# Patient Record
Sex: Male | Born: 1955 | Race: White | Hispanic: No | Marital: Married | Smoking: Former smoker
Health system: Southern US, Community
[De-identification: ages and names within clinical notes are randomized; demographics above are authoritative.]

## PROBLEM LIST (undated history)

## (undated) DIAGNOSIS — I2510 Atherosclerotic heart disease of native coronary artery without angina pectoris: Secondary | ICD-10-CM

## (undated) DIAGNOSIS — M1990 Unspecified osteoarthritis, unspecified site: Secondary | ICD-10-CM

## (undated) DIAGNOSIS — I209 Angina pectoris, unspecified: Secondary | ICD-10-CM

## (undated) DIAGNOSIS — I10 Essential (primary) hypertension: Secondary | ICD-10-CM

## (undated) DIAGNOSIS — I219 Acute myocardial infarction, unspecified: Secondary | ICD-10-CM

## (undated) HISTORY — PX: NASAL FRACTURE SURGERY: SHX718

## (undated) HISTORY — PX: TONSILLECTOMY: SUR1361

---

## 2003-08-13 HISTORY — PX: CARDIAC CATHETERIZATION: SHX172

## 2013-05-18 ENCOUNTER — Other Ambulatory Visit: Payer: Self-pay | Admitting: Neurological Surgery

## 2013-05-18 DIAGNOSIS — M549 Dorsalgia, unspecified: Secondary | ICD-10-CM

## 2013-05-24 ENCOUNTER — Ambulatory Visit
Admission: RE | Admit: 2013-05-24 | Discharge: 2013-05-24 | Disposition: A | Discharge status: Home / Self Care | Payer: Medicare Other | Source: Ambulatory Visit | Attending: Neurological Surgery | Admitting: Neurological Surgery

## 2013-05-24 VITALS — BP 125/84 | HR 61

## 2013-05-24 DIAGNOSIS — M549 Dorsalgia, unspecified: Secondary | ICD-10-CM

## 2013-05-24 MED ORDER — IOHEXOL 180 MG/ML  SOLN
15.0000 mL | Freq: Once | INTRAMUSCULAR | Status: AC | PRN
  Administered 2013-05-24: 20 mL via INTRATHECAL

## 2013-05-24 MED ORDER — DIAZEPAM 5 MG PO TABS: 10.0000 mg | ORAL_TABLET | Freq: Once | ORAL | Status: AC

## 2013-05-24 MED ORDER — MEPERIDINE HCL 100 MG/ML IJ SOLN
100.0000 mg | Freq: Once | INTRAMUSCULAR | Status: AC
  Administered 2013-05-24: 100 mg via INTRAMUSCULAR

## 2013-05-24 MED ORDER — ONDANSETRON HCL 4 MG/2ML IJ SOLN: 4.0000 mg | Freq: Four times a day (QID) | INTRAMUSCULAR | Status: DC | PRN

## 2013-05-24 MED ORDER — ONDANSETRON HCL 4 MG/2ML IJ SOLN
4.0000 mg | Freq: Once | INTRAMUSCULAR | Status: AC
  Administered 2013-05-24: 4 mg via INTRAMUSCULAR

## 2013-05-24 MED ORDER — DIAZEPAM 5 MG PO TABS
10.0000 mg | ORAL_TABLET | Freq: Once | ORAL | Status: AC
  Administered 2013-05-24: 10 mg via ORAL

## 2013-05-24 NOTE — Progress Notes (Signed)
Pt states he has been off tramadol for the past 2-3 days. Discharge instructions explained to pt.

## 2013-07-04 ENCOUNTER — Other Ambulatory Visit: Payer: Self-pay | Admitting: Anesthesiology

## 2013-07-04 DIAGNOSIS — M549 Dorsalgia, unspecified: Secondary | ICD-10-CM

## 2013-07-05 ENCOUNTER — Ambulatory Visit
Admission: RE | Admit: 2013-07-05 | Discharge: 2013-07-05 | Disposition: A | Discharge status: Home / Self Care | Payer: Medicare Other | Source: Ambulatory Visit | Attending: Anesthesiology | Admitting: Anesthesiology

## 2013-07-05 DIAGNOSIS — M549 Dorsalgia, unspecified: Secondary | ICD-10-CM

## 2013-07-16 ENCOUNTER — Other Ambulatory Visit: Payer: Self-pay | Admitting: Neurological Surgery

## 2013-07-31 ENCOUNTER — Inpatient Hospital Stay (HOSPITAL_COMMUNITY)
Admission: RE | Admit: 2013-07-31 | Discharge: 2013-07-31 | Disposition: A | Discharge status: Home / Self Care | Payer: Medicare Other | Source: Ambulatory Visit

## 2013-07-31 NOTE — Pre-Procedure Instructions (Signed)
Dustin NewcomerRobert Kim  07/31/2013   Your procedure is scheduled on:  Wed, Feb 25 @ 12:50 PM  Report to [facility] Entrance A  at 10:45 AM.  Call this number if you have problems the morning of surgery: [phone]   Remember:   Do not eat food or drink liquids after midnight.   Take these medicines the morning of surgery with A SIP OF WATER: Gabapentin(Neurontin) and Metoprolol(Lopressor)               Stop taking your Krill Oil and Aspirin. No Goody's,BC's,Aleve,Ibuprofen,or any Herbal Medications   Do not wear jewelry  Do not wear lotions, powders, or colognes. You may wear deodorant.  Men may shave face and neck.  Do not bring valuables to the hospital.  [facility] is not responsible                  for any belongings or valuables.               Contacts, dentures or bridgework may not be worn into surgery.  Leave suitcase in the car. After surgery it may be brought to your room.  For patients admitted to the hospital, discharge time is determined by your                treatment team.                 Special Instructions:  [place] - Preparing for Surgery  Before surgery, you can play an important role.  Because skin is not sterile, your skin needs to be as free of germs as possible.  You can reduce the number of germs on you skin by washing with CHG (chlorahexidine gluconate) soap before surgery.  CHG is an antiseptic cleaner which kills germs and bonds with the skin to continue killing germs even after washing.  Please DO NOT use if you have an allergy to CHG or antibacterial soaps.  If your skin becomes reddened/irritated stop using the CHG and inform your nurse when you arrive at Short Stay.  Do not shave (including legs and underarms) for at least 48 hours prior to the first CHG shower.  You may shave your face.  Please follow these instructions carefully:   1.  Shower with CHG Soap the night before surgery and the                                morning of  Surgery.  2.  If you choose to wash your hair, wash your hair first as usual with your       normal shampoo.  3.  After you shampoo, rinse your hair and body thoroughly to remove the                      Shampoo.  4.  Use CHG as you would any other liquid soap.  You can apply chg directly       to the skin and wash gently with scrungie or a clean washcloth.  5.  Apply the CHG Soap to your body ONLY FROM THE NECK DOWN.        Do not use on open wounds or open sores.  Avoid contact with your eyes,       ears, mouth and genitals (private parts).  Wash genitals (private parts)       with your normal  soap.  6.  Wash thoroughly, paying special attention to the area where your surgery        will be performed.  7.  Thoroughly rinse your body with warm water from the neck down.  8.  DO NOT shower/wash with your normal soap after using and rinsing off       the CHG Soap.  9.  Pat yourself dry with a clean towel.            10.  Wear clean pajamas.            11.  Place clean sheets on your bed the night of your first shower and do not        sleep with pets.  Day of Surgery  Do not apply any lotions/deoderants the morning of surgery.  Please wear clean clothes to the hospital/surgery center.     Please read over the following fact sheets that you were given: Pain Booklet, Coughing and Deep Breathing, Blood Transfusion Information, MRSA Information and Surgical Site Infection Prevention

## 2013-08-01 ENCOUNTER — Encounter (HOSPITAL_COMMUNITY)
Admission: RE | Admit: 2013-08-01 | Discharge: 2013-08-01 | Disposition: A | Discharge status: Home / Self Care | Payer: Medicare Other | Source: Ambulatory Visit | Attending: Neurological Surgery | Admitting: Neurological Surgery

## 2013-08-01 ENCOUNTER — Encounter (HOSPITAL_COMMUNITY): Payer: Self-pay

## 2013-08-01 DIAGNOSIS — Z01812 Encounter for preprocedural laboratory examination: Secondary | ICD-10-CM | POA: Insufficient documentation

## 2013-08-01 DIAGNOSIS — Z01818 Encounter for other preprocedural examination: Secondary | ICD-10-CM | POA: Insufficient documentation

## 2013-08-01 DIAGNOSIS — Z01810 Encounter for preprocedural cardiovascular examination: Secondary | ICD-10-CM | POA: Insufficient documentation

## 2013-08-01 HISTORY — DX: Unspecified osteoarthritis, unspecified site: M19.90

## 2013-08-01 HISTORY — DX: Acute myocardial infarction, unspecified: I21.9

## 2013-08-01 HISTORY — DX: Essential (primary) hypertension: I10

## 2013-08-01 HISTORY — DX: Atherosclerotic heart disease of native coronary artery without angina pectoris: I25.10

## 2013-08-01 HISTORY — DX: Angina pectoris, unspecified: I20.9

## 2013-08-01 LAB — CBC WITH DIFFERENTIAL/PLATELET
Basophils Absolute: 0 10*3/uL (ref 0.0–0.1)
Basophils Relative: 0 % (ref 0–1)
EOS PCT: 1 % (ref 0–5)
Eosinophils Absolute: 0.1 10*3/uL (ref 0.0–0.7)
HCT: 44.3 % (ref 39.0–52.0)
HEMOGLOBIN: 15.5 g/dL (ref 13.0–17.0)
LYMPHS ABS: 3 10*3/uL (ref 0.7–4.0)
LYMPHS PCT: 32 % (ref 12–46)
MCH: 30.3 pg (ref 26.0–34.0)
MCHC: 35 g/dL (ref 30.0–36.0)
MCV: 86.5 fL (ref 78.0–100.0)
Monocytes Absolute: 0.6 10*3/uL (ref 0.1–1.0)
Monocytes Relative: 7 % (ref 3–12)
Neutro Abs: 5.6 10*3/uL (ref 1.7–7.7)
Neutrophils Relative %: 60 % (ref 43–77)
Platelets: 231 10*3/uL (ref 150–400)
RBC: 5.12 MIL/uL (ref 4.22–5.81)
RDW: 13 % (ref 11.5–15.5)
WBC: 9.4 10*3/uL (ref 4.0–10.5)

## 2013-08-01 LAB — BASIC METABOLIC PANEL
BUN: 20 mg/dL (ref 6–23)
CHLORIDE: 103 meq/L (ref 96–112)
CO2: 27 meq/L (ref 19–32)
Calcium: 10.5 mg/dL (ref 8.4–10.5)
Creatinine, Ser: 0.97 mg/dL (ref 0.50–1.35)
GFR calc Af Amer: >90 mL/min (ref >90)
GFR calc non Af Amer: 90 mL/min — ABNORMAL LOW (ref >90)
Glucose, Bld: 95 mg/dL (ref 70–99)
Potassium: 4.4 mEq/L (ref 3.7–5.3)
SODIUM: 142 meq/L (ref 137–147)

## 2013-08-01 LAB — TYPE AND SCREEN
ABO/RH(D): O POS
ANTIBODY SCREEN: NEGATIVE

## 2013-08-01 LAB — SURGICAL PCR SCREEN
MRSA, PCR: NEGATIVE
Staphylococcus aureus: NEGATIVE

## 2013-08-01 LAB — PROTIME-INR
INR: 0.97 (ref 0.00–1.49)
Prothrombin Time: 12.7 seconds (ref 11.6–15.2)

## 2013-08-01 LAB — ABO/RH: ABO/RH(D): O POS

## 2013-08-01 NOTE — Progress Notes (Signed)
req'd notes, stress, echo, ekg from dr Kenyon Anakurt daniel [facility] [phone]

## 2013-08-01 NOTE — Pre-Procedure Instructions (Addendum)
Tereso NewcomerRobert Knappenberger  08/01/2013   Your procedure is scheduled on:  Wed, Feb 25 @ 12:50 PM  Report to [facility] Short Stay Entrance A  at 10:45 AM.  Call this number if you have problems the morning of surgery: [phone]   Remember:   Do not eat food or drink liquids after midnight.   Take these medicines the morning of surgery with A SIP OF WATER: Gabapentin(Neurontin) and Metoprolol(Lopressor)               Stop taking your Krill Oil and Aspirin. No Goody's,BC's,Aleve,Ibuprofen,or any Herbal Medications   Do not wear jewelry  Do not wear lotions, powders, or colognes. You may wear deodorant.  Men may shave face and neck.  Do not bring valuables to the hospital.  [facility] is not responsible                  for any belongings or valuables.               Contacts, dentures or bridgework may not be worn into surgery.  Leave suitcase in the car. After surgery it may be brought to your room.  For patients admitted to the hospital, discharge time is determined by your                treatment team.                 Special Instructions:  [place] - Preparing for Surgery  Before surgery, you can play an important role.  Because skin is not sterile, your skin needs to be as free of germs as possible.  You can reduce the number of germs on you skin by washing with CHG (chlorahexidine gluconate) soap before surgery.  CHG is an antiseptic cleaner which kills germs and bonds with the skin to continue killing germs even after washing.  Please DO NOT use if you have an allergy to CHG or antibacterial soaps.  If your skin becomes reddened/irritated stop using the CHG and inform your nurse when you arrive at Short Stay.  Do not shave (including legs and underarms) for at least 48 hours prior to the first CHG shower.  You may shave your face.  Please follow these instructions carefully:   1.  Shower with CHG Soap the night before surgery and the                                morning of  Surgery.  2.  If you choose to wash your hair, wash your hair first as usual with your       normal shampoo.  3.  After you shampoo, rinse your hair and body thoroughly to remove the                      Shampoo.  4.  Use CHG as you would any other liquid soap.  You can apply chg directly       to the skin and wash gently with scrungie or a clean washcloth.  5.  Apply the CHG Soap to your body ONLY FROM THE NECK DOWN.        Do not use on open wounds or open sores.  Avoid contact with your eyes,       ears, mouth and genitals (private parts).  Wash genitals (private parts)       with your normal  soap.  6.  Wash thoroughly, paying special attention to the area where your surgery        will be performed.  7.  Thoroughly rinse your body with warm water from the neck down.  8.  DO NOT shower/wash with your normal soap after using and rinsing off       the CHG Soap.  9.  Pat yourself dry with a clean towel.            10.  Wear clean pajamas.            11.  Place clean sheets on your bed the night of your first shower and do not        sleep with pets.  Day of Surgery  Do not apply any lotions/deoderants the morning of surgery.  Please wear clean clothes to the hospital/surgery center.     Please read over the following fact sheets that you were given: Pain Booklet, Coughing and Deep Breathing, Blood Transfusion Information, MRSA Information and Surgical Site Infection Prevention

## 2013-08-01 NOTE — Progress Notes (Signed)
08/01/13 1009  OBSTRUCTIVE SLEEP APNEA  Have you ever been diagnosed with sleep apnea through a sleep study? No  Do you snore loudly (loud enough to be heard through closed doors)?  0  Do you often feel tired, fatigued, or sleepy during the daytime? 0  Has anyone observed you stop breathing during your sleep? 0  Do you have, or are you being treated for high blood pressure? 1  BMI more than 35 kg/m2? 1  Age over 58 years old? 1  Neck circumference greater than 40 cm/18 inches? 0 (16.5)  Gender: 1  Obstructive Sleep Apnea Score 4  Score 4 or greater  Results sent to PCP

## 2013-08-02 NOTE — Progress Notes (Addendum)
Anesthesia Chart Review:  Patient is a 58 year old male scheduled for L5-S1 MAS, PLIF on 08/08/13 by Dr. Yetta BarreJones.   History includes CAD/MI s/p LAD stent ~ '03 ([place], [place]), HTN, arthritis, former smoker, tonsillectomy, nasal fracture surgery. BMI of 39 is consistent with obesity. OSA screen was 4. He reported history of angina, but none recently. Cardiologist is Dr. Tollie PizzaKurt Daniel, last visit 07/04/12.  PCP is Dr. Tarri Fullerichard Escajeda.  EKG on 08/01/13 showed SB @ 53 bpm, sinus arrhythmia, minimal voltage criteria for LVH.   Echo on 08/05/10 ([facility]) showed normal left ventricular systolic function with mild septal left ventricular hypertrophy, LVEF 55-60%. Normal diastolic filling pattern for age. Mildly dilated left atrium. Trace MR/PR. Mild TR with normal pulmonary pressures.  Nuclear stress test on 08/05/09 ([facility]) showed: No convincing evidence of ischemia with small fixed defect in the inferior segment consistent with diaphragmatic attenuation. Normal LV systolic function and wall motion with calculated EF 55%. Images showed little or no change since 01/12/2006.  He reported his last cardiac cath was in 2005.  Preoperative CXR and labs noted.   It will be four years since his last stress test and > one year since his last cardiology appointment.  I reviewed above with anesthesiologist Dr. Randa EvensEdwards. Recommend preoperative cardiac evaluation/clearance.  Erie NoeVanessa at Dr. Yetta BarreJones' office notified.    Velna Ochsllison Seaborn Nakama, PA-C [facility]/Anesthesiology Phone [phone] 08/02/2013 4:10 PM  Addendum 08/13/2013 9:38 AM:  Surgery was rescheduled for 08/16/13 to allow for time for preoperative cardiac evaluation.  [facility] by Pricilla Handlerichard Costello, PA-C on 08/08/13.  EKG then showed SB @ 50 bpm, IVCD with non-specific St/T wave changes. No additional cardiac testing was ordered.  Continuation of perioperative b-blocker therapy was recommended.

## 2013-08-15 MED ORDER — DEXAMETHASONE SODIUM PHOSPHATE 10 MG/ML IJ SOLN
10.0000 mg | INTRAMUSCULAR | Status: AC
  Administered 2013-08-16: 10 mg via INTRAVENOUS
  Filled 2013-08-15: qty 1

## 2013-08-15 MED ORDER — CEFAZOLIN SODIUM-DEXTROSE 2-3 GM-% IV SOLR
2.0000 g | INTRAVENOUS | Status: AC
  Administered 2013-08-16: 2 g via INTRAVENOUS
  Filled 2013-08-15: qty 50

## 2013-08-15 NOTE — Progress Notes (Signed)
I spoke with Mr Dustin Kim and he said he was instructed to arrive at 0830 by MD office.

## 2013-08-16 ENCOUNTER — Encounter (HOSPITAL_COMMUNITY)
Admission: RE | Disposition: A | Discharge status: Home / Self Care | Payer: Self-pay | Source: Ambulatory Visit | Attending: Neurological Surgery

## 2013-08-16 ENCOUNTER — Encounter (HOSPITAL_COMMUNITY): Payer: Self-pay | Admitting: *Deleted

## 2013-08-16 ENCOUNTER — Inpatient Hospital Stay (HOSPITAL_COMMUNITY)
Admission: RE | Admit: 2013-08-16 | Discharge: 2013-08-17 | DRG: 460 | Disposition: A | Discharge status: Home / Self Care | Payer: Medicare Other | Source: Ambulatory Visit | Attending: Neurological Surgery | Admitting: Neurological Surgery

## 2013-08-16 ENCOUNTER — Encounter (HOSPITAL_COMMUNITY): Payer: Medicare Other | Admitting: Vascular Surgery

## 2013-08-16 ENCOUNTER — Inpatient Hospital Stay (HOSPITAL_COMMUNITY): Payer: Medicare Other | Admitting: Certified Registered Nurse Anesthetist

## 2013-08-16 ENCOUNTER — Inpatient Hospital Stay (HOSPITAL_COMMUNITY): Payer: Medicare Other

## 2013-08-16 DIAGNOSIS — I10 Essential (primary) hypertension: Secondary | ICD-10-CM | POA: Diagnosis present

## 2013-08-16 DIAGNOSIS — Z981 Arthrodesis status: Secondary | ICD-10-CM

## 2013-08-16 DIAGNOSIS — I2510 Atherosclerotic heart disease of native coronary artery without angina pectoris: Secondary | ICD-10-CM | POA: Diagnosis present

## 2013-08-16 DIAGNOSIS — I252 Old myocardial infarction: Secondary | ICD-10-CM

## 2013-08-16 DIAGNOSIS — Z79899 Other long term (current) drug therapy: Secondary | ICD-10-CM

## 2013-08-16 DIAGNOSIS — M51379 Other intervertebral disc degeneration, lumbosacral region without mention of lumbar back pain or lower extremity pain: Secondary | ICD-10-CM | POA: Diagnosis present

## 2013-08-16 DIAGNOSIS — Z87891 Personal history of nicotine dependence: Secondary | ICD-10-CM

## 2013-08-16 DIAGNOSIS — M47817 Spondylosis without myelopathy or radiculopathy, lumbosacral region: Principal | ICD-10-CM | POA: Diagnosis present

## 2013-08-16 DIAGNOSIS — M5137 Other intervertebral disc degeneration, lumbosacral region: Secondary | ICD-10-CM | POA: Diagnosis present

## 2013-08-16 DIAGNOSIS — Z9861 Coronary angioplasty status: Secondary | ICD-10-CM

## 2013-08-16 DIAGNOSIS — Z7982 Long term (current) use of aspirin: Secondary | ICD-10-CM

## 2013-08-16 HISTORY — PX: MAXIMUM ACCESS (MAS)POSTERIOR LUMBAR INTERBODY FUSION (PLIF) 1 LEVEL: SHX6368

## 2013-08-16 LAB — COMPREHENSIVE METABOLIC PANEL
ALK PHOS: 56 U/L (ref 39–117)
ALT: 23 U/L (ref 0–53)
AST: 21 U/L (ref 0–37)
Albumin: 3.9 g/dL (ref 3.5–5.2)
BILIRUBIN TOTAL: 0.4 mg/dL (ref 0.3–1.2)
BUN: 17 mg/dL (ref 6–23)
CHLORIDE: 104 meq/L (ref 96–112)
CO2: 23 meq/L (ref 19–32)
Calcium: 9.5 mg/dL (ref 8.4–10.5)
Creatinine, Ser: 0.94 mg/dL (ref 0.50–1.35)
GFR calc non Af Amer: >90 mL/min (ref >90)
GLUCOSE: 111 mg/dL — AB (ref 70–99)
POTASSIUM: 3.8 meq/L (ref 3.7–5.3)
SODIUM: 142 meq/L (ref 137–147)
Total Protein: 7.2 g/dL (ref 6.0–8.3)

## 2013-08-16 LAB — CBC WITH DIFFERENTIAL/PLATELET
Basophils Absolute: 0 10*3/uL (ref 0.0–0.1)
Basophils Relative: 0 % (ref 0–1)
Eosinophils Absolute: 0.2 10*3/uL (ref 0.0–0.7)
Eosinophils Relative: 2 % (ref 0–5)
HCT: 43.3 % (ref 39.0–52.0)
HEMOGLOBIN: 15.1 g/dL (ref 13.0–17.0)
LYMPHS ABS: 2.9 10*3/uL (ref 0.7–4.0)
LYMPHS PCT: 32 % (ref 12–46)
MCH: 30 pg (ref 26.0–34.0)
MCHC: 34.9 g/dL (ref 30.0–36.0)
MCV: 86.1 fL (ref 78.0–100.0)
Monocytes Absolute: 0.8 10*3/uL (ref 0.1–1.0)
Monocytes Relative: 9 % (ref 3–12)
NEUTROS PCT: 57 % (ref 43–77)
Neutro Abs: 5.2 10*3/uL (ref 1.7–7.7)
Platelets: 211 10*3/uL (ref 150–400)
RBC: 5.03 MIL/uL (ref 4.22–5.81)
RDW: 13.1 % (ref 11.5–15.5)
WBC: 9.1 10*3/uL (ref 4.0–10.5)

## 2013-08-16 LAB — TYPE AND SCREEN
ABO/RH(D): O POS
ANTIBODY SCREEN: NEGATIVE

## 2013-08-16 LAB — PROTIME-INR
INR: 0.87 (ref 0.00–1.49)
PROTHROMBIN TIME: 11.7 s (ref 11.6–15.2)

## 2013-08-16 SURGERY — FOR MAXIMUM ACCESS (MAS) POSTERIOR LUMBAR INTERBODY FUSION (PLIF) 1 LEVEL
Anesthesia: General | Site: Back

## 2013-08-16 MED ORDER — ACETAMINOPHEN 10 MG/ML IV SOLN
INTRAVENOUS | Status: AC
  Filled 2013-08-16: qty 100

## 2013-08-16 MED ORDER — METHOCARBAMOL 100 MG/ML IJ SOLN: 500.0000 mg | Freq: Four times a day (QID) | INTRAVENOUS | Status: DC | PRN

## 2013-08-16 MED ORDER — ONDANSETRON HCL 4 MG/2ML IJ SOLN
INTRAMUSCULAR | Status: DC | PRN
  Administered 2013-08-16: 4 mg via INTRAVENOUS

## 2013-08-16 MED ORDER — METHOCARBAMOL 500 MG PO TABS
500.0000 mg | ORAL_TABLET | Freq: Four times a day (QID) | ORAL | Status: DC | PRN
  Administered 2013-08-16 – 2013-08-17 (×2): 500 mg via ORAL
  Filled 2013-08-16 (×2): qty 1

## 2013-08-16 MED ORDER — DIAZEPAM 5 MG/ML IJ SOLN
2.5000 mg | Freq: Once | INTRAMUSCULAR | Status: AC
  Administered 2013-08-16: 2.5 mg via INTRAVENOUS

## 2013-08-16 MED ORDER — TRAMADOL HCL 50 MG PO TABS: 50.0000 mg | ORAL_TABLET | Freq: Four times a day (QID) | ORAL | Status: DC | PRN

## 2013-08-16 MED ORDER — GABAPENTIN 300 MG PO CAPS
300.0000 mg | ORAL_CAPSULE | Freq: Three times a day (TID) | ORAL | Status: DC
  Administered 2013-08-16: 300 mg via ORAL
  Filled 2013-08-16 (×4): qty 1

## 2013-08-16 MED ORDER — SURGIFOAM 100 EX MISC
CUTANEOUS | Status: DC | PRN
  Administered 2013-08-16: 12:00:00 via TOPICAL

## 2013-08-16 MED ORDER — FENTANYL CITRATE 0.05 MG/ML IJ SOLN
INTRAMUSCULAR | Status: DC | PRN
  Administered 2013-08-16: 50 ug via INTRAVENOUS
  Administered 2013-08-16 (×2): 100 ug via INTRAVENOUS
  Administered 2013-08-16: 50 ug via INTRAVENOUS

## 2013-08-16 MED ORDER — MIDAZOLAM HCL 2 MG/2ML IJ SOLN
INTRAMUSCULAR | Status: AC
  Filled 2013-08-16: qty 2

## 2013-08-16 MED ORDER — PHENYLEPHRINE HCL 10 MG/ML IJ SOLN
10.0000 mg | INTRAVENOUS | Status: DC | PRN
  Administered 2013-08-16: 25 ug/min via INTRAVENOUS

## 2013-08-16 MED ORDER — MIDAZOLAM HCL 2 MG/2ML IJ SOLN
0.5000 mg | Freq: Once | INTRAMUSCULAR | Status: AC | PRN
  Administered 2013-08-16: 1 mg via INTRAVENOUS

## 2013-08-16 MED ORDER — LACTATED RINGERS IV SOLN
INTRAVENOUS | Status: DC
  Administered 2013-08-16: 09:00:00 via INTRAVENOUS

## 2013-08-16 MED ORDER — HYDROMORPHONE HCL PF 1 MG/ML IJ SOLN
INTRAMUSCULAR | Status: AC
  Administered 2013-08-16: 0.5 mg via INTRAVENOUS
  Filled 2013-08-16: qty 1

## 2013-08-16 MED ORDER — DIAZEPAM 5 MG/ML IJ SOLN
INTRAMUSCULAR | Status: AC
  Administered 2013-08-16: 2.5 mg via INTRAVENOUS
  Filled 2013-08-16: qty 2

## 2013-08-16 MED ORDER — LOSARTAN POTASSIUM 50 MG PO TABS
50.0000 mg | ORAL_TABLET | Freq: Every day | ORAL | Status: DC
  Filled 2013-08-16 (×2): qty 1

## 2013-08-16 MED ORDER — OXYCODONE HCL 5 MG PO TABS
ORAL_TABLET | ORAL | Status: AC
  Filled 2013-08-16: qty 1

## 2013-08-16 MED ORDER — CEFAZOLIN SODIUM 1-5 GM-% IV SOLN
1.0000 g | Freq: Three times a day (TID) | INTRAVENOUS | Status: AC
  Administered 2013-08-16 – 2013-08-17 (×2): 1 g via INTRAVENOUS
  Filled 2013-08-16 (×2): qty 50

## 2013-08-16 MED ORDER — PROPOFOL INFUSION 10 MG/ML OPTIME
INTRAVENOUS | Status: DC | PRN
  Administered 2013-08-16: 50 ug/kg/min via INTRAVENOUS

## 2013-08-16 MED ORDER — HYDROMORPHONE HCL PF 1 MG/ML IJ SOLN
0.2500 mg | INTRAMUSCULAR | Status: AC | PRN
  Administered 2013-08-16 (×8): 0.5 mg via INTRAVENOUS

## 2013-08-16 MED ORDER — OXYCODONE-ACETAMINOPHEN 5-325 MG PO TABS
1.0000 | ORAL_TABLET | ORAL | Status: DC | PRN
  Administered 2013-08-16 – 2013-08-17 (×3): 2 via ORAL
  Filled 2013-08-16 (×3): qty 2

## 2013-08-16 MED ORDER — LIDOCAINE HCL (CARDIAC) 20 MG/ML IV SOLN
INTRAVENOUS | Status: DC | PRN
  Administered 2013-08-16: 40 mg via INTRAVENOUS

## 2013-08-16 MED ORDER — MEPERIDINE HCL 25 MG/ML IJ SOLN: 6.2500 mg | INTRAMUSCULAR | Status: DC | PRN

## 2013-08-16 MED ORDER — PHENOL 1.4 % MT LIQD: 1.0000 | OROMUCOSAL | Status: DC | PRN

## 2013-08-16 MED ORDER — ASPIRIN EC 81 MG PO TBEC
81.0000 mg | DELAYED_RELEASE_TABLET | Freq: Every day | ORAL | Status: DC
  Administered 2013-08-16: 81 mg via ORAL
  Filled 2013-08-16 (×2): qty 1

## 2013-08-16 MED ORDER — HYDROMORPHONE HCL PF 1 MG/ML IJ SOLN
0.5000 mg | INTRAMUSCULAR | Status: DC | PRN
  Administered 2013-08-16: 0.5 mg via INTRAVENOUS

## 2013-08-16 MED ORDER — 0.9 % SODIUM CHLORIDE (POUR BTL) OPTIME
TOPICAL | Status: DC | PRN
  Administered 2013-08-16: 1000 mL

## 2013-08-16 MED ORDER — ONDANSETRON HCL 4 MG/2ML IJ SOLN: 4.0000 mg | INTRAMUSCULAR | Status: DC | PRN

## 2013-08-16 MED ORDER — PROPOFOL 10 MG/ML IV BOLUS
INTRAVENOUS | Status: DC | PRN
  Administered 2013-08-16: 50 mg via INTRAVENOUS
  Administered 2013-08-16: 160 mg via INTRAVENOUS
  Administered 2013-08-16: 40 mg via INTRAVENOUS

## 2013-08-16 MED ORDER — ACETAMINOPHEN 325 MG PO TABS: 650.0000 mg | ORAL_TABLET | ORAL | Status: DC | PRN

## 2013-08-16 MED ORDER — SODIUM CHLORIDE 0.9 % IJ SOLN: 3.0000 mL | INTRAMUSCULAR | Status: DC | PRN

## 2013-08-16 MED ORDER — LACTATED RINGERS IV SOLN
INTRAVENOUS | Status: DC | PRN
  Administered 2013-08-16 (×2): via INTRAVENOUS

## 2013-08-16 MED ORDER — SODIUM CHLORIDE 0.9 % IV SOLN: 250.0000 mL | INTRAVENOUS | Status: DC

## 2013-08-16 MED ORDER — HYDROMORPHONE HCL PF 1 MG/ML IJ SOLN
INTRAMUSCULAR | Status: AC
  Filled 2013-08-16: qty 2

## 2013-08-16 MED ORDER — SODIUM CHLORIDE 0.9 % IR SOLN
Status: DC | PRN
  Administered 2013-08-16: 12:00:00

## 2013-08-16 MED ORDER — ACETAMINOPHEN 650 MG RE SUPP: 650.0000 mg | RECTAL | Status: DC | PRN

## 2013-08-16 MED ORDER — LOSARTAN POTASSIUM-HCTZ 50-12.5 MG PO TABS: 1.0000 | ORAL_TABLET | Freq: Every day | ORAL | Status: DC

## 2013-08-16 MED ORDER — HYDROMORPHONE HCL PF 1 MG/ML IJ SOLN
INTRAMUSCULAR | Status: AC
  Filled 2013-08-16: qty 1

## 2013-08-16 MED ORDER — PHENYLEPHRINE HCL 10 MG/ML IJ SOLN
INTRAMUSCULAR | Status: DC | PRN
  Administered 2013-08-16 (×5): 80 ug via INTRAVENOUS

## 2013-08-16 MED ORDER — ACETAMINOPHEN 10 MG/ML IV SOLN
1000.0000 mg | Freq: Once | INTRAVENOUS | Status: AC
  Administered 2013-08-16: 1000 mg via INTRAVENOUS

## 2013-08-16 MED ORDER — PROMETHAZINE HCL 25 MG/ML IJ SOLN: 6.2500 mg | INTRAMUSCULAR | Status: DC | PRN

## 2013-08-16 MED ORDER — SODIUM CHLORIDE 0.9 % IJ SOLN
3.0000 mL | Freq: Two times a day (BID) | INTRAMUSCULAR | Status: DC
  Administered 2013-08-16: 3 mL via INTRAVENOUS

## 2013-08-16 MED ORDER — OXYCODONE HCL 5 MG/5ML PO SOLN: 5.0000 mg | Freq: Once | ORAL | Status: AC | PRN

## 2013-08-16 MED ORDER — NITROGLYCERIN 0.4 MG SL SUBL: 0.4000 mg | SUBLINGUAL_TABLET | SUBLINGUAL | Status: DC | PRN

## 2013-08-16 MED ORDER — KETOROLAC TROMETHAMINE 30 MG/ML IJ SOLN
INTRAMUSCULAR | Status: AC
  Administered 2013-08-16: 30 mg via INTRAVENOUS
  Filled 2013-08-16: qty 1

## 2013-08-16 MED ORDER — OXYCODONE HCL 5 MG PO TABS
5.0000 mg | ORAL_TABLET | Freq: Once | ORAL | Status: AC | PRN
  Administered 2013-08-16: 5 mg via ORAL

## 2013-08-16 MED ORDER — HYDROMORPHONE HCL PF 1 MG/ML IJ SOLN: 0.5000 mg | INTRAMUSCULAR | Status: DC | PRN

## 2013-08-16 MED ORDER — CELECOXIB 200 MG PO CAPS
200.0000 mg | ORAL_CAPSULE | Freq: Two times a day (BID) | ORAL | Status: DC
  Administered 2013-08-16: 200 mg via ORAL
  Filled 2013-08-16 (×3): qty 1

## 2013-08-16 MED ORDER — SUCCINYLCHOLINE CHLORIDE 20 MG/ML IJ SOLN
INTRAMUSCULAR | Status: DC | PRN
  Administered 2013-08-16: 140 mg via INTRAVENOUS

## 2013-08-16 MED ORDER — MORPHINE SULFATE 2 MG/ML IJ SOLN: 1.0000 mg | INTRAMUSCULAR | Status: DC | PRN

## 2013-08-16 MED ORDER — METOPROLOL TARTRATE 50 MG PO TABS
50.0000 mg | ORAL_TABLET | Freq: Two times a day (BID) | ORAL | Status: DC
  Filled 2013-08-16 (×3): qty 1

## 2013-08-16 MED ORDER — KETOROLAC TROMETHAMINE 30 MG/ML IJ SOLN
30.0000 mg | Freq: Once | INTRAMUSCULAR | Status: AC
  Administered 2013-08-16: 30 mg via INTRAVENOUS

## 2013-08-16 MED ORDER — MENTHOL 3 MG MT LOZG: 1.0000 | LOZENGE | OROMUCOSAL | Status: DC | PRN

## 2013-08-16 MED ORDER — GLYCOPYRROLATE 0.2 MG/ML IJ SOLN
INTRAMUSCULAR | Status: DC | PRN
  Administered 2013-08-16: 0.2 mg via INTRAVENOUS

## 2013-08-16 MED ORDER — MIDAZOLAM HCL 5 MG/5ML IJ SOLN
INTRAMUSCULAR | Status: DC | PRN
  Administered 2013-08-16: 2 mg via INTRAVENOUS

## 2013-08-16 MED ORDER — POTASSIUM CHLORIDE IN NACL 20-0.9 MEQ/L-% IV SOLN
INTRAVENOUS | Status: DC
  Filled 2013-08-16 (×3): qty 1000

## 2013-08-16 MED ORDER — HYDROCHLOROTHIAZIDE 12.5 MG PO CAPS
12.5000 mg | ORAL_CAPSULE | Freq: Every day | ORAL | Status: DC
  Filled 2013-08-16 (×2): qty 1

## 2013-08-16 MED ORDER — ARTIFICIAL TEARS OP OINT
TOPICAL_OINTMENT | OPHTHALMIC | Status: DC | PRN
  Administered 2013-08-16: 1 via OPHTHALMIC

## 2013-08-16 SURGICAL SUPPLY — 72 items
BAG DECANTER FOR FLEXI CONT (MISCELLANEOUS) ×3 IMPLANT
BENZOIN TINCTURE PRP APPL 2/3 (GAUZE/BANDAGES/DRESSINGS) ×3 IMPLANT
BLADE SURG ROTATE 9660 (MISCELLANEOUS) ×3 IMPLANT
BONE MATRIX OSTEOCEL PRO MED (Bone Implant) ×3 IMPLANT
BUR MATCHSTICK NEURO 3.0 LAGG (BURR) ×3 IMPLANT
CAGE COROENT MP 8X9X23M-8 SPIN (Cage) ×6 IMPLANT
CANISTER SUCT 3000ML (MISCELLANEOUS) ×3 IMPLANT
CLIP NEUROVISION LG (CLIP) ×3 IMPLANT
CLOSURE WOUND 1/2 X4 (GAUZE/BANDAGES/DRESSINGS) ×2
CONT SPEC 4OZ CLIKSEAL STRL BL (MISCELLANEOUS) ×6 IMPLANT
COVER BACK TABLE 24X17X13 BIG (DRAPES) IMPLANT
COVER TABLE BACK 60X90 (DRAPES) ×3 IMPLANT
DRAPE C-ARM 42X72 X-RAY (DRAPES) ×3 IMPLANT
DRAPE C-ARMOR (DRAPES) ×3 IMPLANT
DRAPE LAPAROTOMY 100X72X124 (DRAPES) ×3 IMPLANT
DRAPE POUCH INSTRU U-SHP 10X18 (DRAPES) ×3 IMPLANT
DRAPE SURG 17X23 STRL (DRAPES) ×3 IMPLANT
DRESSING TELFA 8X3 (GAUZE/BANDAGES/DRESSINGS) ×3 IMPLANT
DRSG OPSITE 4X5.5 SM (GAUZE/BANDAGES/DRESSINGS) ×3 IMPLANT
DRSG OPSITE POSTOP 3X4 (GAUZE/BANDAGES/DRESSINGS) ×3 IMPLANT
DRSG OPSITE POSTOP 4X6 (GAUZE/BANDAGES/DRESSINGS) ×3 IMPLANT
DURAPREP 26ML APPLICATOR (WOUND CARE) ×3 IMPLANT
ELECT REM PT RETURN 9FT ADLT (ELECTROSURGICAL) ×3
ELECTRODE REM PT RTRN 9FT ADLT (ELECTROSURGICAL) ×1 IMPLANT
EVACUATOR 1/8 PVC DRAIN (DRAIN) ×3 IMPLANT
GAUZE SPONGE 4X4 16PLY XRAY LF (GAUZE/BANDAGES/DRESSINGS) IMPLANT
GLOVE BIO SURGEON STRL SZ8 (GLOVE) ×6 IMPLANT
GLOVE BIOGEL PI IND STRL 6.5 (GLOVE) ×1 IMPLANT
GLOVE BIOGEL PI IND STRL 7.5 (GLOVE) ×1 IMPLANT
GLOVE BIOGEL PI IND STRL 8.5 (GLOVE) ×1 IMPLANT
GLOVE BIOGEL PI INDICATOR 6.5 (GLOVE) ×2
GLOVE BIOGEL PI INDICATOR 7.5 (GLOVE) ×2
GLOVE BIOGEL PI INDICATOR 8.5 (GLOVE) ×2
GLOVE ECLIPSE 7.5 STRL STRAW (GLOVE) ×3 IMPLANT
GLOVE OPTIFIT SS 6.5 STRL BRWN (GLOVE) ×9 IMPLANT
GOWN BRE IMP SLV AUR LG STRL (GOWN DISPOSABLE) IMPLANT
GOWN BRE IMP SLV AUR XL STRL (GOWN DISPOSABLE) IMPLANT
GOWN STRL REIN 2XL LVL4 (GOWN DISPOSABLE) IMPLANT
GOWN STRL REUS W/ TWL LRG LVL3 (GOWN DISPOSABLE) ×3 IMPLANT
GOWN STRL REUS W/ TWL XL LVL3 (GOWN DISPOSABLE) ×2 IMPLANT
GOWN STRL REUS W/TWL LRG LVL3 (GOWN DISPOSABLE) ×6
GOWN STRL REUS W/TWL XL LVL3 (GOWN DISPOSABLE) ×4
HEMOSTAT POWDER KIT SURGIFOAM (HEMOSTASIS) IMPLANT
KIT BASIN OR (CUSTOM PROCEDURE TRAY) ×3 IMPLANT
KIT NEEDLE NVM5 EMG ELECT (KITS) ×1 IMPLANT
KIT NEEDLE NVM5 EMG ELECTRODE (KITS) ×2
KIT ROOM TURNOVER OR (KITS) ×3 IMPLANT
MILL MEDIUM DISP (BLADE) ×3 IMPLANT
NEEDLE HYPO 25X1 1.5 SAFETY (NEEDLE) ×3 IMPLANT
NS IRRIG 1000ML POUR BTL (IV SOLUTION) ×3 IMPLANT
PACK LAMINECTOMY NEURO (CUSTOM PROCEDURE TRAY) ×3 IMPLANT
PAD ARMBOARD 7.5X6 YLW CONV (MISCELLANEOUS) ×9 IMPLANT
ROD 35MM (Rod) ×6 IMPLANT
SCREW LOCK (Screw) ×8 IMPLANT
SCREW LOCK FXNS SPNE MAS PL (Screw) ×4 IMPLANT
SCREW SHANK 5.0X30MM (Screw) ×6 IMPLANT
SCREW SHANK 6.5X65 (Screw) ×6 IMPLANT
SCREW TULIP 5.5 (Screw) ×12 IMPLANT
SPONGE LAP 4X18 X RAY DECT (DISPOSABLE) IMPLANT
SPONGE SURGIFOAM ABS GEL 100 (HEMOSTASIS) ×3 IMPLANT
STRIP CLOSURE SKIN 1/2X4 (GAUZE/BANDAGES/DRESSINGS) ×4 IMPLANT
SUT VIC AB 0 CT1 18XCR BRD8 (SUTURE) ×1 IMPLANT
SUT VIC AB 0 CT1 8-18 (SUTURE) ×2
SUT VIC AB 2-0 CP2 18 (SUTURE) ×3 IMPLANT
SUT VIC AB 3-0 SH 8-18 (SUTURE) ×6 IMPLANT
SYR 20ML ECCENTRIC (SYRINGE) ×3 IMPLANT
SYR 3ML LL SCALE MARK (SYRINGE) IMPLANT
TOWEL OR 17X24 6PK STRL BLUE (TOWEL DISPOSABLE) ×3 IMPLANT
TOWEL OR 17X26 10 PK STRL BLUE (TOWEL DISPOSABLE) ×3 IMPLANT
TRAY FOLEY CATH 14FRSI W/METER (CATHETERS) IMPLANT
TRAY FOLEY CATH 16FRSI W/METER (SET/KITS/TRAYS/PACK) ×3 IMPLANT
WATER STERILE IRR 1000ML POUR (IV SOLUTION) ×3 IMPLANT

## 2013-08-16 NOTE — Anesthesia Preprocedure Evaluation (Addendum)
Anesthesia Evaluation  Patient identified by MRN, date of birth, ID band Patient awake    Reviewed: Allergy & Precautions, H&P , NPO status , Patient's Chart, lab work & pertinent test results, reviewed documented beta blocker date and time   History of Anesthesia Complications Negative for: history of anesthetic complications  Airway Mallampati: II TM Distance: >3 FB Neck ROM: Full    Dental  (+) Missing, Dental Advisory Given, Teeth Intact   Pulmonary former smoker (quit '97),  breath sounds clear to auscultation  Pulmonary exam normal       Cardiovascular hypertension, Pt. on medications and Pt. on home beta blockers - angina+ CAD (stent LAD) Rhythm:Regular Rate:Normal  '11 stress test: no ischemia, EF 55% '12 ECHO: LVH with normal LVF, EF 55-60% , trace MR   Neuro/Psych Lumbar stenosis    GI/Hepatic negative GI ROS, Neg liver ROS,   Endo/Other  Morbid obesity  Renal/GU negative Renal ROS     Musculoskeletal   Abdominal (+) + obese,   Peds  Hematology negative hematology ROS (+)   Anesthesia Other Findings   Reproductive/Obstetrics negative OB ROS                       Anesthesia Physical Anesthesia Plan  ASA: III  Anesthesia Plan: General   Post-op Pain Management:    Induction: Intravenous  Airway Management Planned: Oral ETT  Additional Equipment:   Intra-op Plan:   Post-operative Plan: Extubation in OR  Informed Consent: I have reviewed the patients History and Physical, chart, labs and discussed the procedure including the risks, benefits and alternatives for the proposed anesthesia with the patient or authorized representative who has indicated his/her understanding and acceptance.   Dental advisory given  Plan Discussed with: Surgeon and CRNA  Anesthesia Plan Comments: (Plan routine monitors, GETA)        Anesthesia Quick Evaluation

## 2013-08-16 NOTE — H&P (Signed)
Subjective: Patient is a 58 y.o. male admitted for PLIF L5-S1. Onset of symptoms was several months ago, gradually worsening since that time.  The pain is rated severe, and is located at the across the lower back. The pain is described as aching and occurs intermittently. The symptoms have been progressive. Symptoms are exacerbated by exercise. MRI or CT showed degenerative disc disease with retrolisthesis L5-S1. Discography was positive at L5-S1 with normal control at L3-4 and L4-5.   Past Medical History  Diagnosis Date  . Coronary artery disease     ischemic heart disease  . Myocardial infarction   . Anginal pain     non recent  . Hypertension   . Arthritis     Past Surgical History  Procedure Laterality Date  . Cardiac catheterization  03,05    stent03, last cath 05  . Nasal fracture surgery      58 yrs old  . Tonsillectomy      Prior to Admission medications   Medication Sig Start Date End Date Taking? Authorizing Provider  aspirin EC 81 MG tablet Take 81 mg by mouth daily.   Yes Historical Provider, MD  gabapentin (NEURONTIN) 300 MG capsule Take 300 mg by mouth 3 (three) times daily.   Yes Historical Provider, MD  losartan-hydrochlorothiazide (HYZAAR) 50-12.5 MG per tablet Take 1 tablet by mouth daily.   Yes Historical Provider, MD  metoprolol (LOPRESSOR) 50 MG tablet Take 50 mg by mouth 2 (two) times daily.   Yes Historical Provider, MD  OMEGA-3 KRILL OIL PO Take 1 capsule by mouth daily.   Yes Historical Provider, MD  traMADol (ULTRAM) 50 MG tablet Take by mouth every 6 (six) hours as needed.   Yes Historical Provider, MD  nitroGLYCERIN (NITROSTAT) 0.4 MG SL tablet Place 0.4 mg under the tongue every 5 (five) minutes as needed for chest pain.    Historical Provider, MD   No Known Allergies  History  Substance Use Topics  . Smoking status: Former Smoker -- 2.00 packs/day for 24 years    Types: Cigarettes    Quit date: 05/24/1996  . Smokeless tobacco: Not on file  .  Alcohol Use: No    History reviewed. No pertinent family history.   Review of Systems  Positive ROS: Negative  All other systems have been reviewed and were otherwise negative with the exception of those mentioned in the HPI and as above.  Objective: Vital signs in last 24 hours: Temp:  [97.5 F (36.4 C)] 97.5 F (36.4 C) (03/05 0827) Pulse Rate:  [58] 58 (03/05 0827) BP: (141)/(82) 141/82 mmHg (03/05 0827) SpO2:  [99 %] 99 % (03/05 0827)  General Appearance: Alert, cooperative, no distress, appears stated age Head: Normocephalic, without obvious abnormality, atraumatic Eyes: PERRL, conjunctiva/corneas clear, EOM's intact    Neck: Supple, symmetrical, trachea midline Back: Symmetric, no curvature, ROM normal, no CVA tenderness Lungs:  respirations unlabored Heart: Regular rate and rhythm Abdomen: Soft, non-tender Extremities: Extremities normal, atraumatic, no cyanosis or edema Pulses: 2+ and symmetric all extremities Skin: Skin color, texture, turgor normal, no rashes or lesions  NEUROLOGIC:   Mental status: Alert and oriented x4,  no aphasia, good attention span, fund of knowledge, and memory Motor Exam - grossly normal Sensory Exam - grossly normal Reflexes: 1+ Coordination - grossly normal Gait - grossly normal Balance - grossly normal Cranial Nerves: I: smell Not tested  II: visual acuity  OS: nl    OD: nl  II: visual fields Full to confrontation  II: pupils Equal, round, reactive to light  III,VII: ptosis None  III,IV,VI: extraocular muscles  Full ROM  V: mastication Normal  V: facial light touch sensation  Normal  V,VII: corneal reflex  Present  VII: facial muscle function - upper  Normal  VII: facial muscle function - lower Normal  VIII: hearing Not tested  IX: soft palate elevation  Normal  IX,X: gag reflex Present  XI: trapezius strength  5/5  XI: sternocleidomastoid strength 5/5  XI: neck flexion strength  5/5  XII: tongue strength  Normal     Data Review Lab Results  Component Value Date   WBC 9.1 08/16/2013   HGB 15.1 08/16/2013   HCT 43.3 08/16/2013   MCV 86.1 08/16/2013   PLT 211 08/16/2013   Lab Results  Component Value Date   NA 142 08/16/2013   K 3.8 08/16/2013   CL 104 08/16/2013   CO2 23 08/16/2013   BUN 17 08/16/2013   CREATININE 0.94 08/16/2013   GLUCOSE 111* 08/16/2013   Lab Results  Component Value Date   INR 0.87 08/16/2013    Assessment/Plan: Patient admitted for PLIF L5-S1. Patient has failed a reasonable attempt at conservative therapy.  I explained the condition and procedure to the patient and answered any questions.  Patient wishes to proceed with procedure as planned. Understands risks/ benefits and typical outcomes of procedure.   Tyner Codner S 08/16/2013 10:57 AM

## 2013-08-16 NOTE — Progress Notes (Signed)
Dr. Jackson at the bedside

## 2013-08-16 NOTE — OR Nursing (Signed)
Pt. States pain level is "70% better than before".  Pts RR 15 with sats 96% on 2 L .  Pt. Easily arousable.

## 2013-08-16 NOTE — Preoperative (Signed)
Beta Blockers   Reason not to administer Beta Blockers:Not Applicable 

## 2013-08-16 NOTE — Transfer of Care (Signed)
Immediate Anesthesia Transfer of Care Note  Patient: Dustin Kim  Procedure(s) Performed: Procedure(s) with comments: FOR MAXIMUM ACCESS (MAS) POSTERIOR LUMBAR INTERBODY FUSION (PLIF) 1 LEVEL (N/A) - FOR MAXIMUM ACCESS (MAS) POSTERIOR LUMBAR INTERBODY FUSION (PLIF) 1 LEVEL  Patient Location: PACU  Anesthesia Type:General  Level of Consciousness: awake, oriented and patient cooperative  Airway & Oxygen Therapy: Patient Spontanous Breathing and Patient connected to face mask oxygen  Post-op Assessment: Report given to PACU RN and Post -op Vital signs reviewed and stable  Post vital signs: Reviewed  Complications: No apparent anesthesia complications

## 2013-08-16 NOTE — Anesthesia Postprocedure Evaluation (Signed)
  Anesthesia Post-op Note  Patient: Dustin Kim  Procedure(s) Performed: Procedure(s) with comments: FOR MAXIMUM ACCESS (MAS) POSTERIOR LUMBAR INTERBODY FUSION (PLIF) 1 LEVEL (N/A) - FOR MAXIMUM ACCESS (MAS) POSTERIOR LUMBAR INTERBODY FUSION (PLIF) 1 LEVEL  Patient Location: PACU  Anesthesia Type:General  Level of Consciousness: awake and alert   Airway and Oxygen Therapy: Patient Spontanous Breathing  Post-op Pain: mild  Post-op Assessment: Post-op Vital signs reviewed  Post-op Vital Signs: stable  Complications: No apparent anesthesia complications

## 2013-08-16 NOTE — Progress Notes (Signed)
Utilization review completed.  

## 2013-08-16 NOTE — Op Note (Signed)
08/16/2013  1:40 PM  PATIENT:  Dustin Kim  58 y.o. male  PRE-OPERATIVE DIAGNOSIS:  Degenerative disc disease, retrolisthesis, spondylosis, stenosis L5-S1 with back and leg pain  POST-OPERATIVE DIAGNOSIS:  Same  PROCEDURE:   1. Decompressive lumbar laminectomy L5-S1 requiring more work than would be required for a simple exposure of the disk for PLIF in order to adequately decompress the neural elements and address the spinal stenosis 2. Posterior lumbar interbody fusion L5-S1 using PEEK interbody cages packed with morcellized allograft and autograft 3. Posterior fixation L5-S1 using cortical pedicle screws.    SURGEON:  Marikay Alaravid Simaya Lumadue, MD  ASSISTANTS: Dr. Maeola HarmanJoseph Stern  ANESTHESIA:  General  EBL: 250 ml  Total I/O In: 1250 [I.V.:1250] Out: 550 [Urine:300; Blood:250]  BLOOD ADMINISTERED:none  DRAINS: Hemovac   INDICATION FOR PROCEDURE: This patient presented with a several year history of progressive back problems. He had back and leg pain. He had MRI which showed significant disc desiccation and retrolisthesis with some stenosis at L5-S1. He had a provocative discogram which confirm pain generating from the L5-S1 disc with normal controls at L3-4 L4-5. Recommended a decompression instrumented fusion at L5-S1. Patient understood the risks, benefits, and alternatives and potential outcomes and wished to proceed.  PROCEDURE DETAILS:  The patient was brought to the operating room. After induction of generalized endotracheal anesthesia the patient was rolled into the prone position on chest rolls and all pressure points were padded. The patient's lumbar region was cleaned and then prepped with DuraPrep and draped in the usual sterile fashion. Anesthesia was injected and then a dorsal midline incision was made and carried down to the lumbosacral fascia. The fascia was opened and the paraspinous musculature was taken down in a subperiosteal fashion to expose L5-S1. A self-retaining retractor  was placed. Intraoperative fluoroscopy confirmed my level, and I started with placement of the L5 cortical pedicle screws. The pedicle screw entry zones were identified utilizing surface landmarks and  AP and lateral fluoroscopy. I scored the cortex with the high-speed drill and then used the hand drill and EMG monitoring to drill an upward and outward direction into the pedicle. I then tapped line to line, and the tap was also monitored. I then placed a 5-0 x 30 mm cortical pedicle screw into the pedicles of L5 bilaterally. I then turned my attention to the decompression and the spinous process was removed and complete lumbar laminectomies, hemi- facetectomies, and foraminotomies were performed at L5-S1. The patient had significant spinal stenosis and this required more work than would be required for a simple exposure of the disc for posterior lumbar interbody fusion. Much more generous decompression was undertaken in order to adequately decompress the neural elements and address the patient's leg pain. The yellow ligament was removed to expose the underlying dura and nerve roots, and generous foraminotomies were performed to adequately decompress the neural elements. Both the exiting and traversing nerve roots were decompressed on both sides until a coronary dilator passed easily along the nerve roots. Once the decompression was complete, I turned my attention to the posterior lower lumbar interbody fusion. The epidural venous vasculature was coagulated and cut sharply. Disc space was incised and the initial discectomy was performed with pituitary rongeurs. The disc space was distracted with sequential distractors to a height of 8 mm. We then used a series of scrapers and shavers to prepare the endplates for fusion. The midline was prepared with Epstein curettes. Once the complete discectomy was finished, we packed an appropriate sized peek interbody  cage (8 mm x 23 mm x 8 lordotic) with local autograft and  morcellized allograft, gently retracted the nerve root, and tapped the cage into position at L5-S1.  The midline between the cages was packed with morselized autograft and allograft. We then turned our attention to the placement of the lower pedicle screws. The pedicle screw entry zones were identified utilizing surface landmarks and fluoroscopy. I drilled into each pedicle utilizing the hand drill and EMG monitoring, and tapped each pedicle with the appropriate tap. We palpated with a ball probe to assure no break in the cortex. We then placed 6 5 x 35 mm cortical pedicle screws into the pedicles bilaterally at S1. . We then placed lordotic rods into the multiaxial screw heads of the pedicle screws and locked these in position with the locking caps and anti-torque device. We then checked our construct with AP and lateral fluoroscopy. Irrigated with copious amounts of bacitracin-containing saline solution. Placed a medium Hemovac drain through separate stab incision. Inspected the nerve roots once again to assure adequate decompression, lined to the dura with Gelfoam, and closed the muscle and the fascia with 0 Vicryl. Closed the subcutaneous tissues with 2-0 Vicryl and subcuticular tissues with 3-0 Vicryl. The skin was closed with benzoin and Steri-Strips. Dressing was then applied, the patient was awakened from general anesthesia and transported to the recovery room in stable condition. At the end of the procedure all sponge, needle and instrument counts were correct.   PLAN OF CARE: Admit to inpatient   PATIENT DISPOSITION:  PACU - hemodynamically stable.   Delay start of Pharmacological VTE agent (>24hrs) due to surgical blood loss or risk of bleeding:  yes

## 2013-08-17 MED ORDER — METHOCARBAMOL 500 MG PO TABS: 500.0000 mg | ORAL_TABLET | Freq: Four times a day (QID) | ORAL | Status: AC | PRN

## 2013-08-17 MED ORDER — OXYCODONE-ACETAMINOPHEN 5-325 MG PO TABS: 1.0000 | ORAL_TABLET | Freq: Four times a day (QID) | ORAL | Status: AC | PRN

## 2013-08-17 NOTE — Progress Notes (Signed)
Pt doing very well. Pt and wife given D/C instructions with Rx's, verbal understanding of teaching was given. Pt D/C'd home via wheelchair @ 0920 per MD order. Pt is stable @ D/C and has no other needs. Rema FendtAshley Neko Mcgeehan, RN

## 2013-08-17 NOTE — Evaluation (Signed)
Physical Therapy Evaluation Patient Details Name: Dustin NewcomerRobert Kim MRN: [id number] DOB: Nov 24, 1955 Today's Date: 08/17/2013 Time: 0815-0826 PT Time Calculation (min): 11 min  PT Assessment / Plan / Recommendation History of Present Illness  pt presents with L5-S1 PLIF.    Clinical Impression  Pt moving great.  Pt ed on back precautions and moderating activity at home.  Discussed donning and doffing socks and hygiene after BM while following back precautions.  No further PT needs at this time.  Will sign off.      PT Assessment  Patent does not need any further PT services    Follow Up Recommendations  No PT follow up    Does the patient have the potential to tolerate intense rehabilitation      Barriers to Discharge        Equipment Recommendations  None recommended by PT    Recommendations for Other Services     Frequency      Precautions / Restrictions Precautions Precautions: Back;Fall Precaution Booklet Issued:  (RN will provide) Precaution Comments: Reviewed back precautions Required Braces or Orthoses: Spinal Brace Spinal Brace: Lumbar corset;Applied in sitting position Restrictions Weight Bearing Restrictions: No   Pertinent Vitals/Pain Incisional pain, but states much better than pre-surgery.        Mobility  Bed Mobility Overal bed mobility: Modified Independent Transfers Overall transfer level: Modified independent Ambulation/Gait Ambulation/Gait assistance: Modified independent (Device/Increase time) Ambulation Distance (Feet): 300 Feet Assistive device: None Gait Pattern/deviations: Step-through pattern;Decreased stride length Gait velocity interpretation: Below normal speed for age/gender General Gait Details: Discussed pacing and back precautions during ambulation.   Stairs: Yes Stairs assistance: Supervision Stair Management: No rails;Step to pattern;Forwards Number of Stairs: 1    Exercises     PT Diagnosis:    PT Problem List:   PT  Treatment Interventions:       PT Goals(Current goals can be found in the care plan section)    Visit Information  Last PT Received On: 08/17/13 Assistance Needed: +1 History of Present Illness: pt presents with L5-S1 PLIF.         Prior Functioning  Home Living Family/patient expects to be discharged to:: Private residence Living Arrangements: Spouse/significant other Available Help at Discharge: Family;Available PRN/intermittently Type of Home: House Home Access: Stairs to enter Entergy CorporationEntrance Stairs-Number of Steps: 1 Entrance Stairs-Rails: None Home Layout: One level Home Equipment: None Prior Function Level of Independence: Independent Communication Communication: No difficulties    Cognition  Cognition Arousal/Alertness: Awake/alert Behavior During Therapy: WFL for tasks assessed/performed Overall Cognitive Status: Within Functional Limits for tasks assessed    Extremity/Trunk Assessment Upper Extremity Assessment Upper Extremity Assessment: Overall WFL for tasks assessed Lower Extremity Assessment Lower Extremity Assessment: Overall WFL for tasks assessed   Balance    End of Session PT - End of Session Equipment Utilized During Treatment: Back brace Activity Tolerance: Patient tolerated treatment well Patient left: in bed;with call bell/phone within reach (sitting EOB) Nurse Communication: Mobility status  GP     RitenourAlison Murray, Gerhardt Gleed F, PT [phone] 08/17/2013, 8:37 AM

## 2013-08-17 NOTE — Discharge Summary (Signed)
Physician Discharge Summary  Patient ID: Dustin NewcomerRobert Kim MRN: [id number] DOB/AGE: 16-Nov-1955 58 y.o.  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 The patient tolerated the procedure well and was taken to the recovery room and then to the floor in stable condition. The hospital course was routine. There were no complications. The wound remained clean dry and intact. Pt had appropriate back soreness. No complaints of leg pain or new N/T/W. The patient remained afebrile with stable vital signs, and tolerated a regular diet. The patient continued to increase activities, and pain was well controlled with oral pain medications.   Consults: None  Significant Diagnostic Studies:  Results for orders placed during the hospital encounter of 08/16/13  CBC WITH DIFFERENTIAL      Result Value Ref Range   WBC 9.1  4.0 - 10.5 K/uL   RBC 5.03  4.22 - 5.81 MIL/uL   Hemoglobin 15.1  13.0 - 17.0 g/dL   HCT 14.743.3  82.939.0 - 56.252.0 %   MCV 86.1  78.0 - 100.0 fL   MCH 30.0  26.0 - 34.0 pg   MCHC 34.9  30.0 - 36.0 g/dL   RDW 13.013.1  86.511.5 - 78.415.5 %   Platelets 211  150 - 400 K/uL   Neutrophils Relative % 57  43 - 77 %   Neutro Abs 5.2  1.7 - 7.7 K/uL   Lymphocytes Relative 32  12 - 46 %   Lymphs Abs 2.9  0.7 - 4.0 K/uL   Monocytes Relative 9  3 - 12 %   Monocytes Absolute 0.8  0.1 - 1.0 K/uL   Eosinophils Relative 2  0 - 5 %   Eosinophils Absolute 0.2  0.0 - 0.7 K/uL   Basophils Relative 0  0 - 1 %   Basophils Absolute 0.0  0.0 - 0.1 K/uL  COMPREHENSIVE METABOLIC PANEL      Result Value Ref Range   Sodium 142  137 - 147 mEq/L   Potassium 3.8  3.7 - 5.3 mEq/L   Chloride 104  96 - 112 mEq/L   CO2 23  19 - 32 mEq/L   Glucose, Bld 111 (*) 70 - 99 mg/dL   BUN 17   6 - 23 mg/dL   Creatinine, Ser 6.960.94  0.50 - 1.35 mg/dL   Calcium 9.5  8.4 - 29.510.5 mg/dL   Total Protein 7.2  6.0 - 8.3 g/dL   Albumin 3.9  3.5 - 5.2 g/dL   AST 21  0 - 37 U/L   ALT 23  0 - 53 U/L   Alkaline Phosphatase 56  39 - 117 U/L   Total Bilirubin 0.4  0.3 - 1.2 mg/dL   GFR calc non Af Amer >90  >90 mL/min   GFR calc Af Amer >90  >90 mL/min  PROTIME-INR      Result Value Ref Range   Prothrombin Time 11.7  11.6 - 15.2 seconds   INR 0.87  0.00 - 1.49  TYPE AND SCREEN      Result Value Ref Range   ABO/RH(D) O POS     Antibody Screen NEG     Sample Expiration 08/19/2013      Chest 2 View  08/01/2013   CLINICAL DATA:  Preoperative evaluation for lumbar spine  surgery, stenosis, history coronary artery disease post MI, hypertension, smoking  EXAM: CHEST  2 VIEW  COMPARISON:  12/21/2006  FINDINGS: Upper normal heart size.  Normal mediastinal contours and pulmonary vascularity.  Lungs clear.  No pleural effusion or pneumothorax.  Mild scattered endplate spur formation thoracic spine.  IMPRESSION: No acute abnormalities.   Electronically Signed   By: Ulyses Southward M.D.   On: 08/01/2013 10:47   Dg Lumbar Spine 2-3 Views  08/16/2013   CLINICAL DATA:  L5-S1 PLIF.  EXAM: LUMBAR SPINE - 2-3 VIEW  COMPARISON:  Discogram 07/05/2013.  FINDINGS: Intraoperative fluoro spot images demonstrate bilateral pedicle screw and rod fixation at L5-S1. The screws are contained within the pedicles. The disc spacer is intact. The patient is status post wide laminectomy.  IMPRESSION: Status post L5-S1 PLIF without radiographic evidence for complication.   Electronically Signed   By: Gennette Pac M.D.   On: 08/16/2013 15:47    Antibiotics:  Anti-infectives   Start     Dose/Rate Route Frequency Ordered Stop   08/16/13 1800  ceFAZolin (ANCEF) IVPB 1 g/50 mL premix     1 g 100 mL/hr over 30 Minutes Intravenous Every 8 hours 08/16/13 1714 08/17/13 0335   08/16/13 1147  bacitracin 50,000 Units in sodium chloride  irrigation 0.9 % 500 mL irrigation  Status:  Discontinued       As needed 08/16/13 1147 08/16/13 1354   08/16/13 0600  ceFAZolin (ANCEF) IVPB 2 g/50 mL premix     2 g 100 mL/hr over 30 Minutes Intravenous On call to O.R. 08/15/13 1404 08/16/13 1110      Discharge Exam: Blood pressure 133/62, pulse 72, temperature 97.9 F (36.6 C), temperature source Oral, resp. rate 16, SpO2 97.00%. Neurologic: Grossly normal Incision clean dry and intact  Discharge Medications:     Medication List         aspirin EC 81 MG tablet  Take 81 mg by mouth daily.     gabapentin 300 MG capsule  Commonly known as:  NEURONTIN  Take 300 mg by mouth 3 (three) times daily.     losartan-hydrochlorothiazide 50-12.5 MG per tablet  Commonly known as:  HYZAAR  Take 1 tablet by mouth daily.     methocarbamol 500 MG tablet  Commonly known as:  ROBAXIN  Take 1 tablet (500 mg total) by mouth every 6 (six) hours as needed for muscle spasms.     metoprolol 50 MG tablet  Commonly known as:  LOPRESSOR  Take 50 mg by mouth 2 (two) times daily.     nitroGLYCERIN 0.4 MG SL tablet  Commonly known as:  NITROSTAT  Place 0.4 mg under the tongue every 5 (five) minutes as needed for chest pain.     OMEGA-3 KRILL OIL PO  Take 1 capsule by mouth daily.     oxyCODONE-acetaminophen 5-325 MG per tablet  Commonly known as:  PERCOCET/ROXICET  Take 1-2 tablets by mouth every 6 (six) hours as needed for moderate pain.     traMADol 50 MG tablet  Commonly known as:  ULTRAM  Take by mouth every 6 (six) hours as needed.        Disposition: Home   Final Dx: PLIF L5-S1      Discharge Orders   Future Orders Complete By Expires   Call MD for:  difficulty breathing, headache or visual disturbances  As directed    Call MD for:  persistant nausea and vomiting  As directed  Call MD for:  redness, tenderness, or signs of infection (pain, swelling, redness, odor or green/yellow discharge around incision site)  As directed     Call MD for:  severe uncontrolled pain  As directed    Call MD for:  temperature >100.4  As directed    Diet - low sodium heart healthy  As directed    Discharge instructions  As directed    Comments:     Take it easy, no strenuous activity, no twisting or bending, no driving, no lifting more than 8 pounds   Increase activity slowly  As directed    Remove dressing in 48 hours  As directed       Follow-up Information   Follow up with Presten Joost S, MD. Schedule an appointment as soon as possible for a visit in 2 weeks.   Specialty:  Neurosurgery   Contact information:   [address] [place] [address] [phone]        Signed: Tia Alert 08/17/2013, 8:29 AM

## 2013-08-20 ENCOUNTER — Encounter (HOSPITAL_COMMUNITY): Payer: Self-pay | Admitting: Neurological Surgery

## 2014-03-12 ENCOUNTER — Other Ambulatory Visit: Payer: Self-pay | Admitting: Neurological Surgery

## 2014-03-12 DIAGNOSIS — M545 Low back pain, unspecified: Secondary | ICD-10-CM

## 2014-04-01 ENCOUNTER — Other Ambulatory Visit: Payer: Medicare Other

## 2015-02-20 IMAGING — CT CT L SPINE W/ CM
4 of 11 series · 11 of 33 positions shown, 13 images · non-contrast
Comparison: MRI from [REDACTED] dated 04/19/2013.

CLINICAL DATA: Back pain. Right-sided low back pain radiating to
the sacrum and into the posterior right leg. Numbness and tingling
in the right leg.

EXAM:
LUMBAR MYELOGRAM
TECHNIQUE: Contiguous axial images were obtained through the Lumbar spine after
the intrathecal infusion of infusion. Coronal and sagittal
reconstructions were obtained of the axial image sets.

[Series 2: l spine bone · axial · 0.27mm/px · z∈[-345,-98]mm · 3 of 100 slices shown, 4 images]
[im 1/100  soft-tissue]
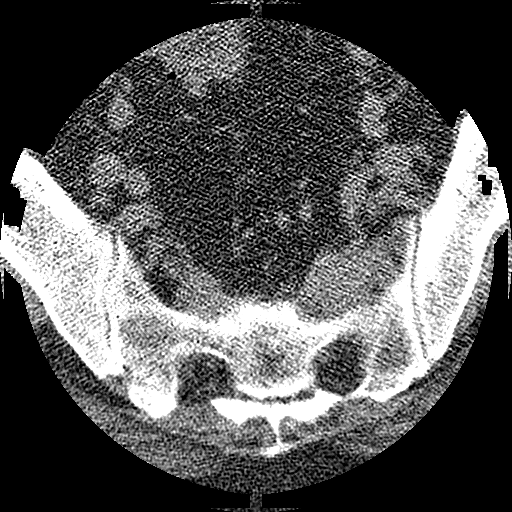
[im 1/100  bone]
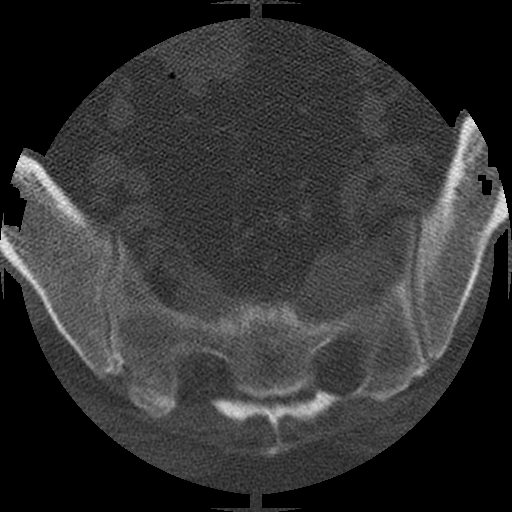
[im 50/100  bone]
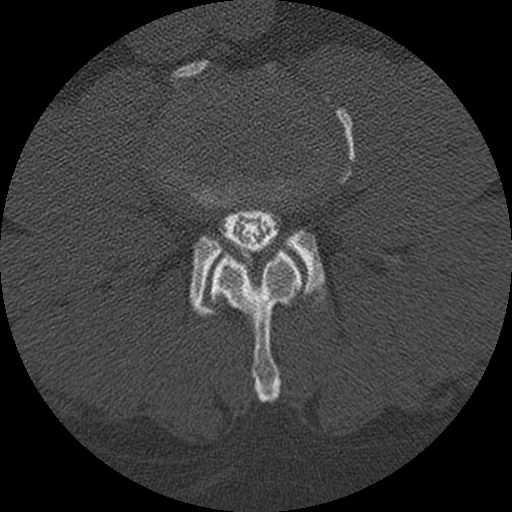
[im 100/100  bone]
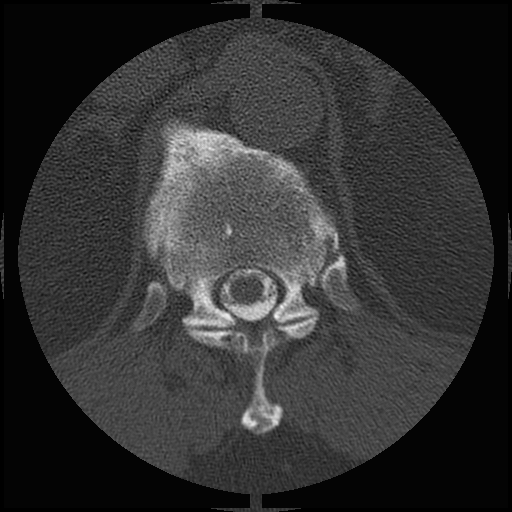

[Series 3: l spine soft · axial · 0.27mm/px · z∈[-262,-180]mm · 2 of 100 slices shown]
[im 34/100  soft-tissue]
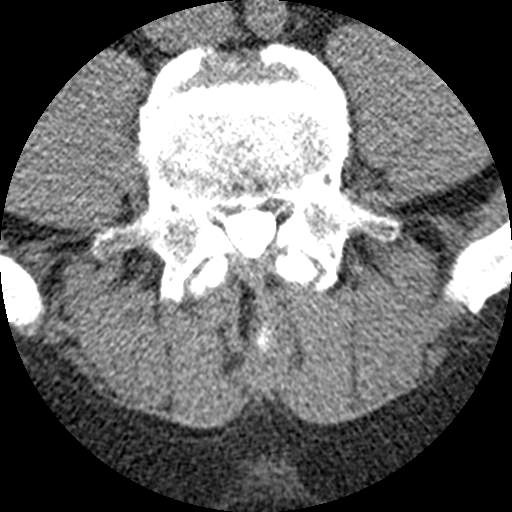
[im 67/100  soft-tissue]
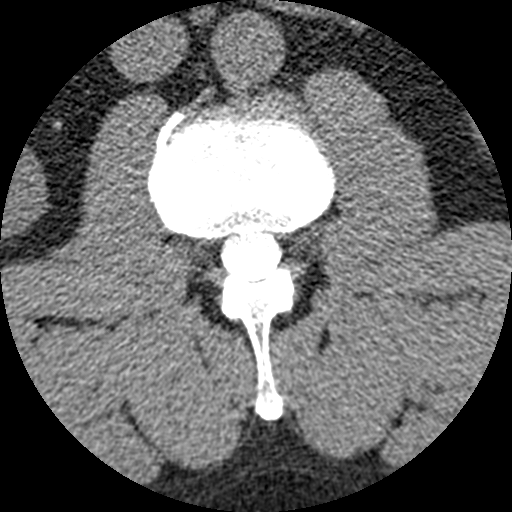

[Series 401: cor lower · coronal · 0.49mm/px · 1 of 65 slices shown]
[im 33/65  bone]
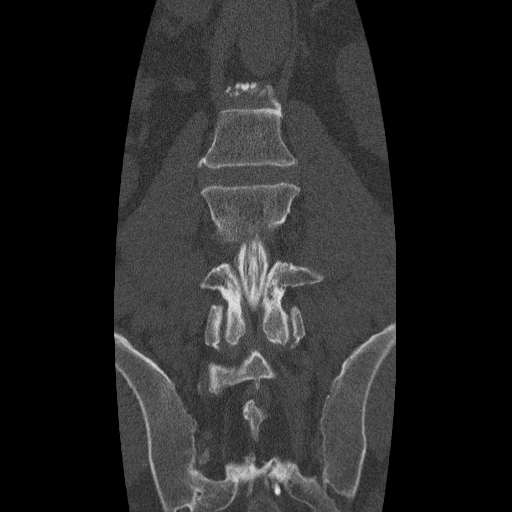

[Series 402: sag · sagittal · 0.49mm/px · 5 of 66 slices shown, 6 images]
[im 22/66  bone]
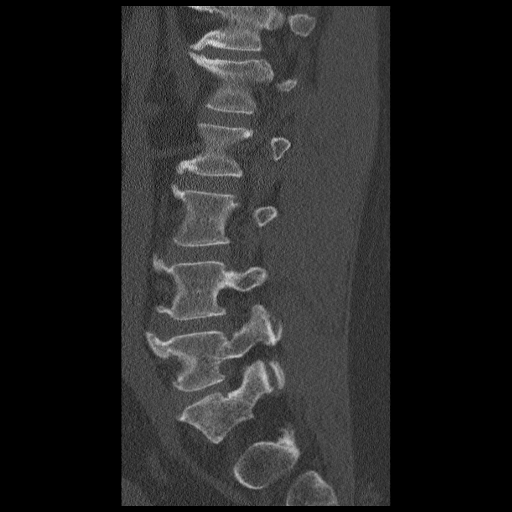
[im 28/66  bone]
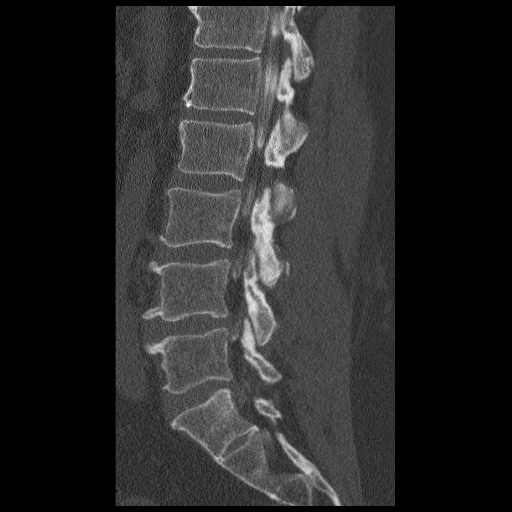
[im 33/66  soft-tissue]
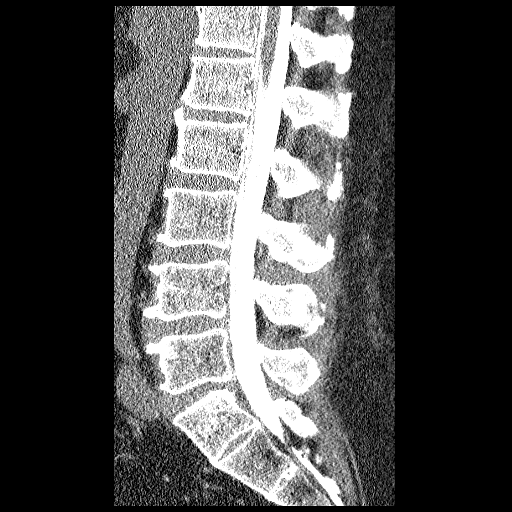
[im 33/66  bone]
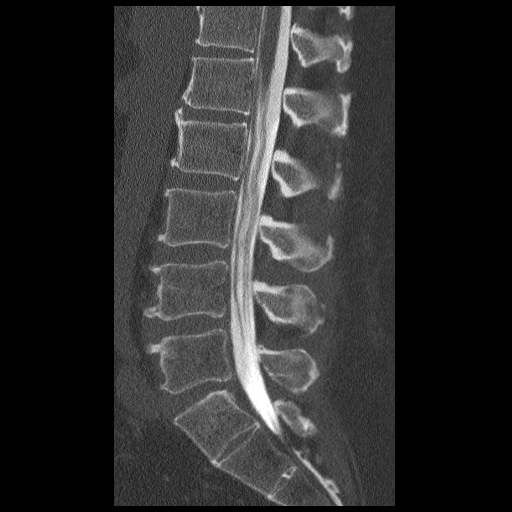
[im 38/66  bone]
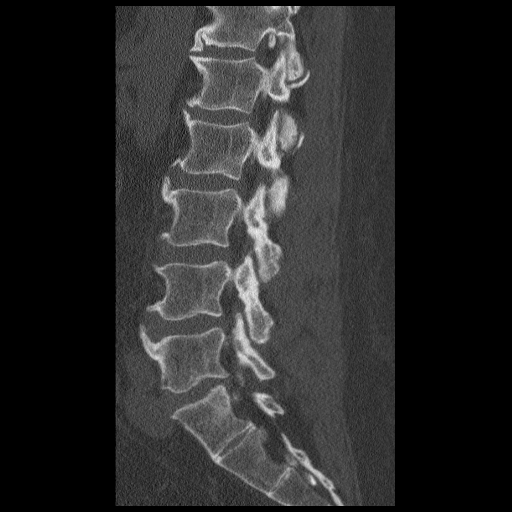
[im 44/66  bone]
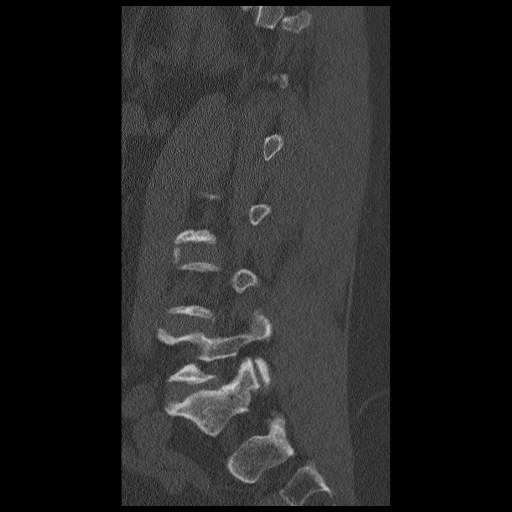

[11 of 33 positions shown; findings below may reference images not displayed]

Previous MRI 08/04/2011.

FLUOROSCOPY TIME:  0 min 48 seconds

PROCEDURE:
After thorough discussion of risks and benefits of the procedure
including bleeding, infection, injury to nerves, blood vessels,
adjacent structures as well as headache and CSF leak, written and
oral informed consent was obtained. Consent was obtained by Dr.
Karan Palomera. Time out form was completed.

Patient was positioned prone on the fluoroscopy table. Local
anesthesia was provided with 1% lidocaine without epinephrine after
prepped and draped in the usual sterile fashion. Puncture was
performed at L3-L4 using a 3 1/2 inch 22 gauge pencil point Yulissa
needle via right paramedian approach. Using a single pass through
the dura, the needle was placed within the thecal sac, with return
of clear CSF. 15 mL of Cmnipaque-GHN was injected into the thecal
sac, with normal opacification of the nerve roots and cauda equina
consistent with free flow within the subarachnoid space.

I personally performed the lumbar puncture and administered the
intrathecal contrast. I also personally supervised acquisition of
the myelogram images.
FINDINGS: LUMBAR MYELOGRAM FINDINGS:

Mild multilevel degenerative disc disease is present. L5-S1 disc
space loss. In the prone position, there is a shallow anterior
extradural impression on the thecal sac at L4-L5. No lateral recess
stenosis is identified by plain films. Central canal shows mild
stenosis at L3-L4 and L4-L5 secondary to bulging. L5-S1 has 5 mm of
grade I retrolisthesis in the standing upright position without
narrowing of the thecal sac. There is no instability identified with
flexion and extension. Large left marginal osteophyte from the
inferior endplate of L5 is incidentally noted.

CT LUMBAR MYELOGRAM FINDINGS:

The paraspinal soft tissues show aortic atherosclerosis. Ankylosis
of the SI joints is probably degenerative. No erosive changes of the
SI joints. The spinal cord terminates posterior to the L1 vertebra.
Vertebral body height is preserved. No destructive osseous lesions
are identified.

T12-L1:  Mild disc degeneration.  No stenosis.

L1-L2:  Mild disc degeneration.  No stenosis.

L2-L3:  Mild disc degeneration.  No stenosis.

L3-L4:  Mild disc degeneration.  No stenosis.

L4-L5: Mild disc degeneration. Shallow posterior disc bulging.
Bulging minimally flattens the ventral thecal sac. The lateral
recesses appear adequately patent. Normal filling of the L5 nerve
root sleeves. Mild facet arthrosis. The foramina appear patent.

L5-S1: Grade I retrolisthesis associated with loss of disc space.
This appears unchanged compared to the standing upright radiographs.
Mild bilateral facet arthrosis is present. Loss of disc height is
present with a small central disc protrusion. The thecal sac is
patent. Right facet arthrosis produces mild encroachment on the
descending right S1 nerve without complete effacement of contrast
within nerve root sleeve. No discrete neural compression. The
foramina appear adequately patent. Nearly bridging left lateral
osteophyte.
IMPRESSION: 1. Technically successful lumbar puncture for lumbar myelogram.
2. Mild lumbar spondylosis. Spondylosis is most pronounced at L5-S1
with grade I retrolisthesis and a small central disc protrusion
however there is no significant resulting stenosis. Mild L4-L5
stenosis associated with shallow broad-based disc bulging.

## 2018-10-03 ENCOUNTER — Other Ambulatory Visit: Payer: Self-pay | Admitting: Physical Medicine and Rehabilitation

## 2018-10-03 DIAGNOSIS — M48061 Spinal stenosis, lumbar region without neurogenic claudication: Secondary | ICD-10-CM

## 2018-11-20 ENCOUNTER — Other Ambulatory Visit: Payer: Self-pay

## 2018-12-07 ENCOUNTER — Ambulatory Visit
Admission: RE | Admit: 2018-12-07 | Discharge: 2018-12-07 | Disposition: A | Discharge status: Home / Self Care | Payer: Medicare Other | Source: Ambulatory Visit | Attending: Physical Medicine and Rehabilitation | Admitting: Physical Medicine and Rehabilitation

## 2018-12-07 DIAGNOSIS — M48061 Spinal stenosis, lumbar region without neurogenic claudication: Secondary | ICD-10-CM

## 2018-12-07 MED ORDER — GADOBENATE DIMEGLUMINE 529 MG/ML IV SOLN
20.0000 mL | Freq: Once | INTRAVENOUS | Status: AC | PRN
  Administered 2018-12-07: 20 mL via INTRAVENOUS
